# Patient Record
Sex: Female | Born: 1960 | Hispanic: No | Smoking: Former smoker
Health system: Southern US, Community
[De-identification: ages and names within clinical notes are randomized; demographics above are authoritative.]

## PROBLEM LIST (undated history)

## (undated) DIAGNOSIS — K219 Gastro-esophageal reflux disease without esophagitis: Secondary | ICD-10-CM

## (undated) DIAGNOSIS — T7840XA Allergy, unspecified, initial encounter: Secondary | ICD-10-CM

## (undated) DIAGNOSIS — D649 Anemia, unspecified: Secondary | ICD-10-CM

## (undated) DIAGNOSIS — A159 Respiratory tuberculosis unspecified: Secondary | ICD-10-CM

## (undated) HISTORY — PX: APPENDECTOMY: SHX54

## (undated) HISTORY — DX: Respiratory tuberculosis unspecified: A15.9

## (undated) HISTORY — PX: LIPOMA EXCISION: SHX5283

## (undated) HISTORY — DX: Allergy, unspecified, initial encounter: T78.40XA

## (undated) HISTORY — PX: URETHRAL SLING: SHX2621

## (undated) HISTORY — DX: Anemia, unspecified: D64.9

## (undated) HISTORY — DX: Gastro-esophageal reflux disease without esophagitis: K21.9

## (undated) HISTORY — PX: EXPLORATORY LAPAROTOMY: SUR591

## (undated) HISTORY — PX: ABDOMINAL HYSTERECTOMY: SHX81

## (undated) HISTORY — PX: TONSILLECTOMY: SUR1361

## (undated) HISTORY — PX: UPPER GASTROINTESTINAL ENDOSCOPY: SHX188

---

## 1998-03-05 ENCOUNTER — Other Ambulatory Visit: Admission: RE | Admit: 1998-03-05 | Discharge: 1998-03-05 | Payer: Self-pay | Admitting: Obstetrics and Gynecology

## 1999-05-04 ENCOUNTER — Other Ambulatory Visit: Admission: RE | Admit: 1999-05-04 | Discharge: 1999-05-04 | Payer: Self-pay | Admitting: Obstetrics and Gynecology

## 2009-03-05 IMAGING — US MAMMO-LUNI-US
1 series · 8 of 8 positions shown · non-contrast
Comparison: NONE

CLINICAL DATA: Bolahthuraa Nshrm RT(R)(M)   Diagnostic Mammogram. 

LEFT BREAST MAMMOGRAM ADDITIONAL VIEW AND LEFT BREAST ULTRASOUND

[Series 1: us breast · 0.07mm/px · 8 of 8 slices shown]
[im 1/8]
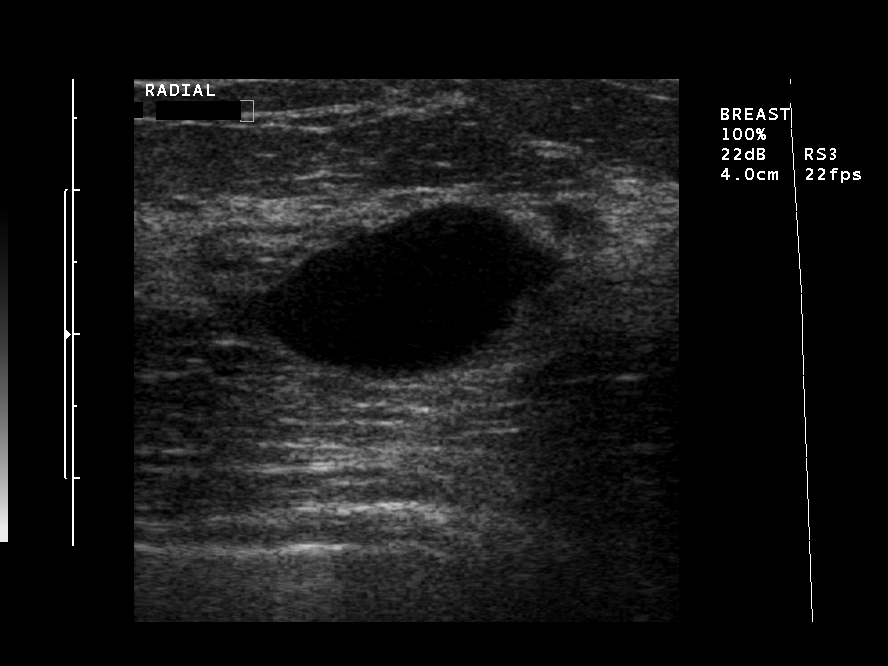
[im 2/8]
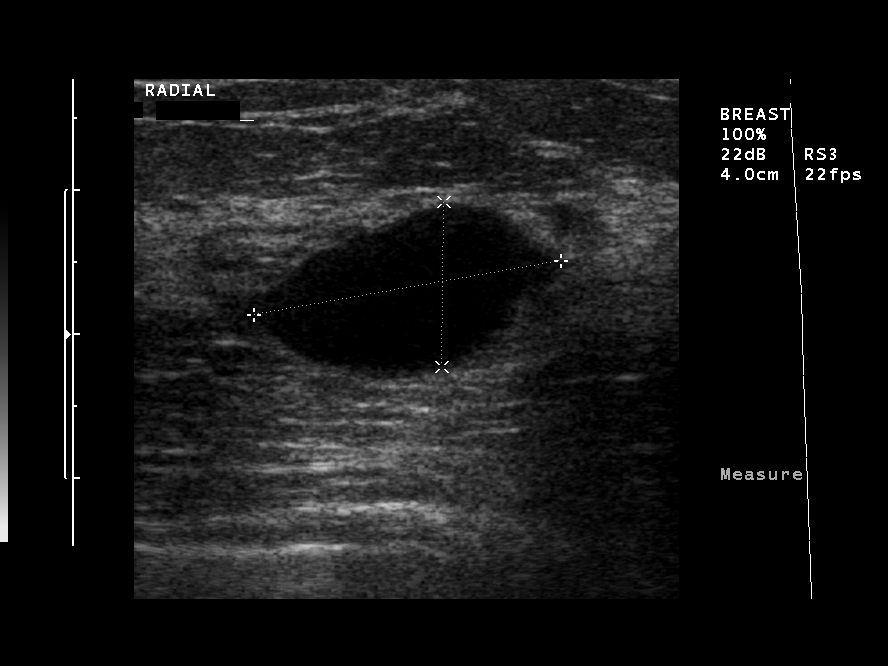
[im 3/8]
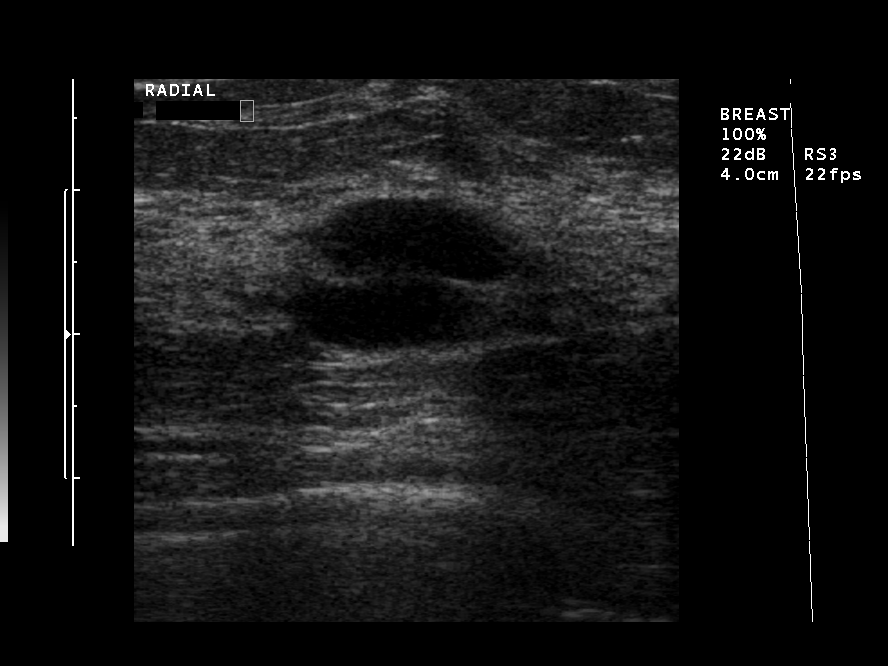
[im 4/8]
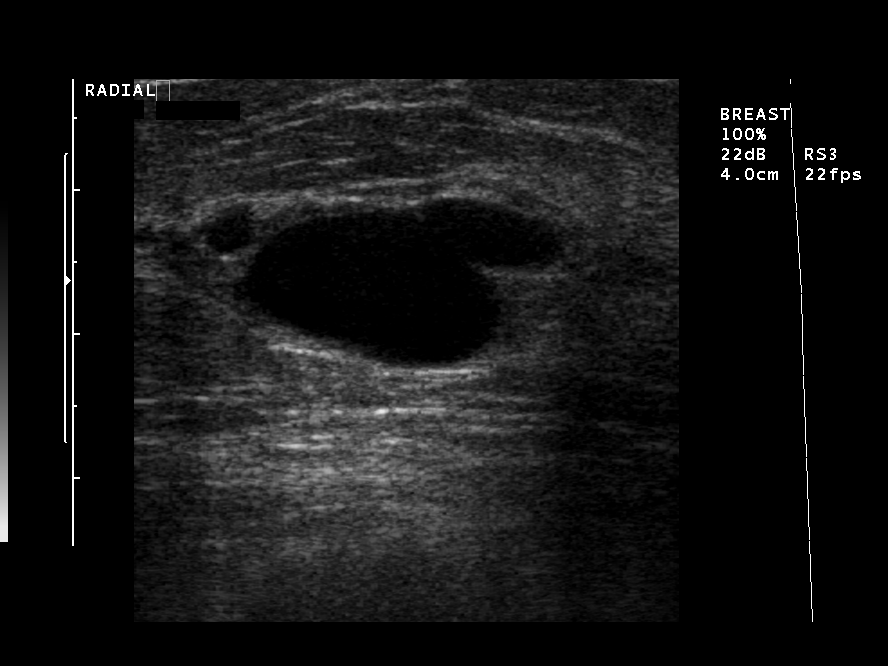
[im 5/8]
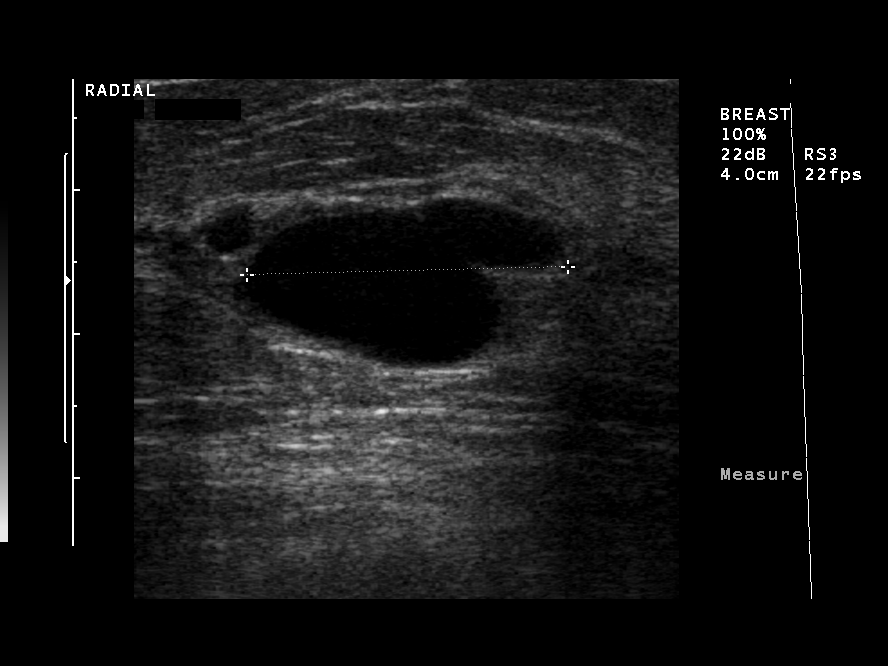
[im 6/8]
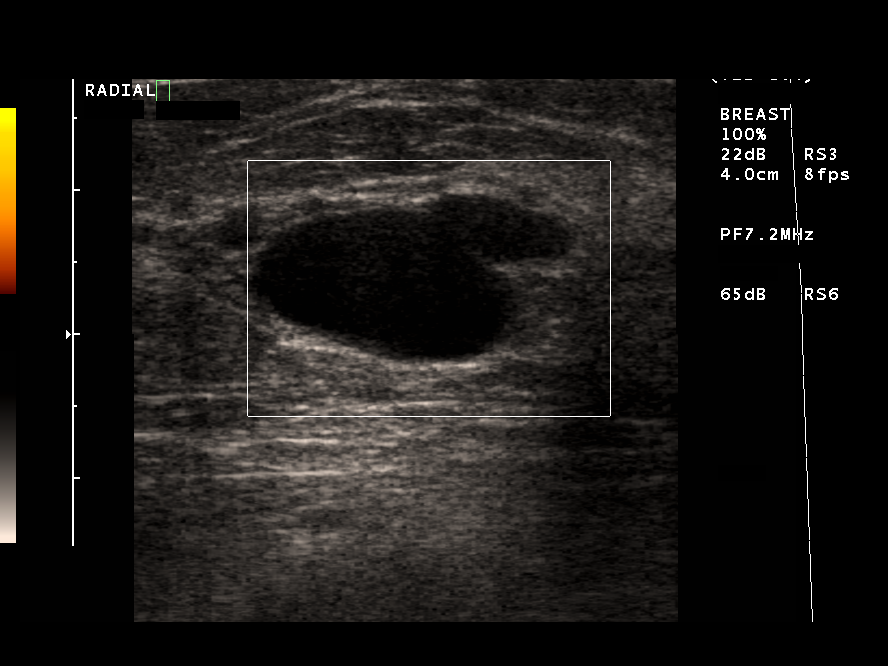
[im 7/8]
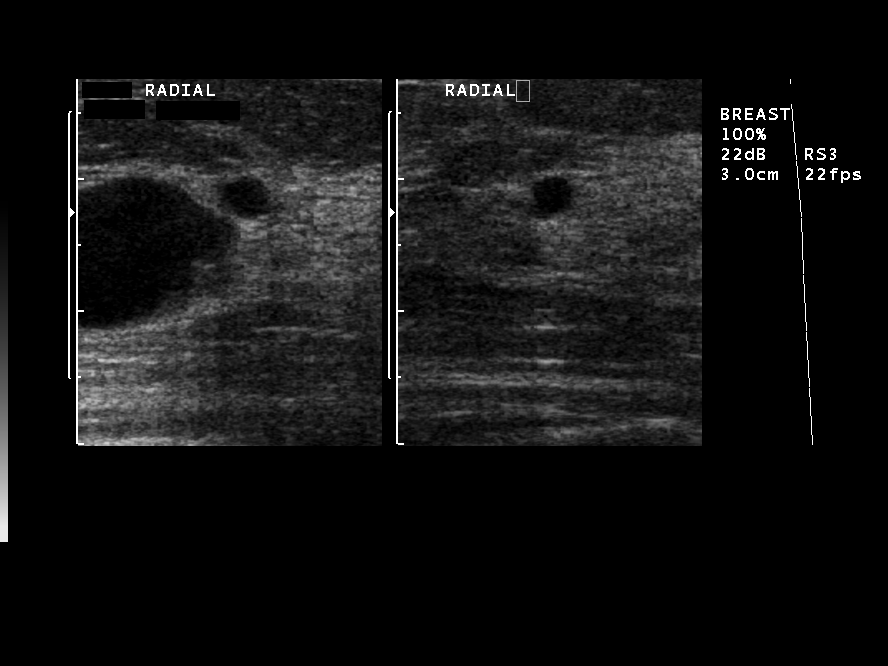
[im 8/8]
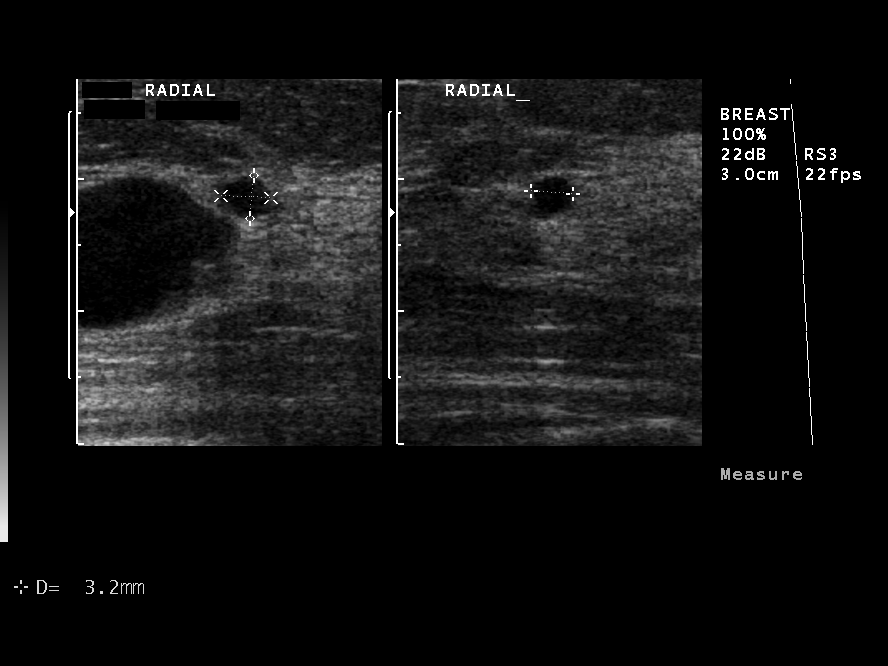

[8 of 8 positions shown; findings below may reference images not displayed]

FINDINGS: Increasing density in the left breast in the 12 
o???clock position corresponds to a cyst measuring 22.3 x 21.6 x 
11.5 mm.  This likely represents a cluster of cysts.  There is an 
adjacent tiny cyst measuring 3.2 x 3.8 x 3.3 mm.
IMPRESSION: Increasing density in the left breast 
mammographically is consistent with a cluster of benign cysts with 
a large dominant cyst, with dimensions given above.  Bilateral 
mammogram in one year is recommended. The patient was informed at 
the time of the examination of the findings and recommendations by 
verbal and written lay report. Computer assisted (Second Look) 
technology was used as an aid in interpretation of this study. 
BI-RADS 2- Benign Guarneri, Reynaert electronically reviewed 
on 11/21/2006 Dict Date: 11/20/2006  Tran Date:  11/21/2006 GIJSJE  
SHAMYRAT

## 2009-05-06 ENCOUNTER — Other Ambulatory Visit: Admission: RE | Admit: 2009-05-06 | Discharge: 2009-05-06 | Payer: Self-pay | Admitting: Radiology

## 2012-12-27 ENCOUNTER — Emergency Department (HOSPITAL_BASED_OUTPATIENT_CLINIC_OR_DEPARTMENT_OTHER)
Admission: EM | Admit: 2012-12-27 | Discharge: 2012-12-27 | Disposition: A | Discharge status: Home / Self Care | Payer: BC Managed Care – PPO | Attending: Emergency Medicine | Admitting: Emergency Medicine

## 2012-12-27 ENCOUNTER — Encounter (HOSPITAL_BASED_OUTPATIENT_CLINIC_OR_DEPARTMENT_OTHER): Payer: Self-pay | Admitting: Emergency Medicine

## 2012-12-27 ENCOUNTER — Emergency Department (HOSPITAL_BASED_OUTPATIENT_CLINIC_OR_DEPARTMENT_OTHER): Payer: BC Managed Care – PPO

## 2012-12-27 DIAGNOSIS — Z90710 Acquired absence of both cervix and uterus: Secondary | ICD-10-CM | POA: Insufficient documentation

## 2012-12-27 DIAGNOSIS — L500 Allergic urticaria: Secondary | ICD-10-CM | POA: Insufficient documentation

## 2012-12-27 DIAGNOSIS — Z91040 Latex allergy status: Secondary | ICD-10-CM | POA: Insufficient documentation

## 2012-12-27 DIAGNOSIS — T360X5A Adverse effect of penicillins, initial encounter: Secondary | ICD-10-CM | POA: Insufficient documentation

## 2012-12-27 DIAGNOSIS — T7840XA Allergy, unspecified, initial encounter: Secondary | ICD-10-CM

## 2012-12-27 DIAGNOSIS — L509 Urticaria, unspecified: Secondary | ICD-10-CM

## 2012-12-27 DIAGNOSIS — Z79899 Other long term (current) drug therapy: Secondary | ICD-10-CM | POA: Insufficient documentation

## 2012-12-27 DIAGNOSIS — F411 Generalized anxiety disorder: Secondary | ICD-10-CM | POA: Insufficient documentation

## 2012-12-27 MED ORDER — DIPHENHYDRAMINE HCL 50 MG/ML IJ SOLN
25.0000 mg | Freq: Once | INTRAMUSCULAR | Status: AC
  Administered 2012-12-27: 25 mg via INTRAVENOUS

## 2012-12-27 MED ORDER — LORAZEPAM 1 MG PO TABS
0.5000 mg | ORAL_TABLET | Freq: Once | ORAL | Status: AC
  Administered 2012-12-27: 0.5 mg via ORAL
  Filled 2012-12-27: qty 1

## 2012-12-27 MED ORDER — SODIUM CHLORIDE 0.9 % IV BOLUS (SEPSIS)
1000.0000 mL | Freq: Once | INTRAVENOUS | Status: AC
  Administered 2012-12-27: 1000 mL via INTRAVENOUS

## 2012-12-27 MED ORDER — DIPHENHYDRAMINE HCL 25 MG PO CAPS
50.0000 mg | ORAL_CAPSULE | Freq: Once | ORAL | Status: AC
  Administered 2012-12-27: 50 mg via ORAL
  Filled 2012-12-27: qty 2

## 2012-12-27 MED ORDER — DIPHENHYDRAMINE HCL 50 MG/ML IJ SOLN
INTRAMUSCULAR | Status: AC
  Administered 2012-12-27: 25 mg via INTRAVENOUS
  Filled 2012-12-27: qty 1

## 2012-12-27 MED ORDER — EPINEPHRINE 0.3 MG/0.3ML IJ SOAJ: 0.3000 mg | Freq: Once | INTRAMUSCULAR | Status: AC

## 2012-12-27 MED ORDER — PREDNISONE 20 MG PO TABS: ORAL_TABLET | ORAL | Status: DC

## 2012-12-27 NOTE — ED Notes (Addendum)
Pt c/o rash to augmentin and went to Urgent care 1 hr ago and was given depo-medrol injection and pt sts rash is worse since. Pt has diffuse urticaria and redness to hands and feet. No airway or oral swelling at present. Pt speaking in complete sentences.

## 2012-12-27 NOTE — ED Notes (Signed)
Pt states that she feels her rash is worse, states "I just feel bad..." this rn observes that rash is subsiding. Pt states "I don't think it's getting better at all..."

## 2012-12-27 NOTE — ED Notes (Signed)
No drooling noted when looking at the pt.   Pt. Able to swallow her pill.

## 2012-12-27 NOTE — ED Notes (Signed)
Pt requests to speak with md, dr. Jodi Mourning alerted.

## 2012-12-27 NOTE — ED Notes (Signed)
MD at bedside. 

## 2012-12-27 NOTE — ED Notes (Signed)
Pt. Up and walking steady gait to restroom.  NO distress noted.

## 2012-12-27 NOTE — ED Notes (Signed)
Pt states "I refuse to leave. I feel like there is something going on with my throat, and I am scared. I am not leaving."

## 2012-12-27 NOTE — ED Provider Notes (Addendum)
CSN: [id number]     Arrival date & time 12/27/12  4098 History   First MD Initiated Contact with Patient 12/27/12 0932     Chief Complaint  Patient presents with  . 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  Patient had hysterectomy/ sling procedure one week ago, developed sinus congestion and placed on augmentin two days prior, after three doses had mild rash and swelling extremities, worsened today after fourth dose.  In urgent care worsened further after solumedrol.  No hx of significant allergic rx except with erythromycin.  No other new exposures.  Pelvic incisions healing well.  No fevers or vomiting.  No abdo pain.  No tongue swelling.    Patient is a 52 y.o. female presenting with allergic reaction. The history is provided by the patient.  Allergic Reaction Presenting symptoms: rash     History reviewed. No pertinent past medical history. Past Surgical History  Procedure Laterality Date  . Abdominal hysterectomy    . Urethral sling     No family history on file. History  Substance Use Topics  . Smoking status: Never Smoker   . Smokeless tobacco: Not on file  . Alcohol Use: Yes   OB History   Grav Para Term Preterm Abortions TAB SAB Ect Mult Living                 Review of Systems  Constitutional: Negative for fever and chills.  HENT: Negative for congestion.   Eyes: Negative for visual disturbance.  Respiratory: Negative for shortness of breath and stridor.   Gastrointestinal: Negative for vomiting and abdominal pain.  Genitourinary: Negative for dysuria and flank pain.  Musculoskeletal: Negative for neck pain and neck stiffness.  Skin: Positive for rash.  Neurological: Negative for light-headedness and headaches.  Psychiatric/Behavioral: The patient is nervous/anxious.      Allergies  Augmentin; Erythromycin; and Latex  Home Medications   Current Outpatient Rx  Name  Route  Sig  Dispense  Refill  . diphenhydrAMINE (BENADRYL) 25 MG tablet   Oral   Take 25 mg by mouth every 6 (six) hours as needed.         . Ibuprofen (MOTRIN PO)   Oral   Take by mouth.         . magnesium 30 MG tablet   Oral   Take 30 mg by mouth 2 (two) times daily.         . ValACYclovir HCl (VALTREX PO)   Oral   Take by mouth.          There were no vitals taken for this visit. Physical Exam  Nursing note and vitals reviewed. Constitutional: She is oriented to person, place, and time. She appears well-developed and well-nourished.  HENT:  Head: Normocephalic and atraumatic.  No trismus, uvular deviation, unilateral posterior pharyngeal edema or submandibular swelling.   Eyes: Conjunctivae are normal. Right eye exhibits no discharge. Left eye exhibits no discharge.  Neck: Normal range of motion. Neck supple. No tracheal deviation present.  Cardiovascular: Normal rate and regular rhythm.   Pulmonary/Chest: Effort normal and breath sounds normal.  Abdominal: Soft.  Neurological: She is alert and oriented to person, place, and time.  Skin: Skin is warm. Rash noted.  Patchy and local hives, some elevated on LE/ feet bilateral, no petechia or purpura No angioedema  Psychiatric: She has a normal mood and affect.  ED Course  Procedures (including critical care time) Labs Review Labs Reviewed - No data to display Imaging Review Dg Neck Soft Tissue  12/27/2012   CLINICAL DATA:  Rash and swelling after taking Augmentin for several days. Solu-Medrol given at Urgent Care Clinic.  EXAM: NECK SOFT TISSUES - 1+ VIEW  COMPARISON:  None.  FINDINGS: Epiglottis and aryepiglottic folds appear within normal limits. Prevertebral soft tissues are normal. Moderate lower cervical spondylosis is present with anterior spurring. Slight reversal of the normal cervical lordosis  which is probably positional.  IMPRESSION: Normal soft tissue neck.  Cervical spondylosis.   Electronically Signed   By: Andreas Newport M.D.   On: 12/27/2012 14:25    EKG Interpretation   None       MDM   1. Hives   2. Allergic reaction, initial encounter    Allergic reaction.  No angioedema. Observed in ED 3 hrs.  Plan for benadryl/ fluids and observation for 2 hrs. Recheck. Pt improved on recheck mild hives. No angioedema, reasons to return discussed.   Results and differential diagnosis were discussed with the patient. Close follow up outpatient was discussed, patient comfortable with the plan.         Enid Skeens, MD 12/27/12 1223  Pt felt uncomfortable going home, felt scratching in her throat, pt anxious in the room. Discussed lateral xray to reassure, recheck exam lungs clear, no stridor, no angioedema. Reassurred pt.  Benadryl and fluids given. Pt well appearing, mild hives.  Sending home with epi pen in case needed, return instructions given.  Discharged  Enid Skeens, MD 12/27/12 [phone]

## 2014-03-04 ENCOUNTER — Encounter: Payer: Self-pay | Admitting: Internal Medicine

## 2014-04-30 ENCOUNTER — Encounter: Payer: Self-pay | Admitting: Internal Medicine

## 2014-05-05 ENCOUNTER — Encounter: Payer: Self-pay | Admitting: Internal Medicine

## 2014-05-14 ENCOUNTER — Encounter: Payer: Self-pay | Admitting: Internal Medicine

## 2014-06-04 ENCOUNTER — Encounter: Payer: Self-pay | Admitting: Internal Medicine

## 2014-07-09 ENCOUNTER — Encounter: Payer: Self-pay | Admitting: Internal Medicine

## 2014-08-11 ENCOUNTER — Encounter: Payer: Self-pay | Admitting: Gastroenterology

## 2014-09-02 ENCOUNTER — Encounter: Payer: Self-pay | Admitting: Internal Medicine

## 2014-10-16 ENCOUNTER — Ambulatory Visit (AMBULATORY_SURGERY_CENTER): Payer: Self-pay

## 2014-10-16 VITALS — Ht 62.0 in | Wt 123.8 lb

## 2014-10-16 DIAGNOSIS — Z1211 Encounter for screening for malignant neoplasm of colon: Secondary | ICD-10-CM

## 2014-10-16 MED ORDER — SUPREP BOWEL PREP KIT 17.5-3.13-1.6 GM/177ML PO SOLN: 1.0000 | Freq: Once | ORAL | Status: DC

## 2014-10-16 NOTE — Progress Notes (Signed)
No allergies to eggs or soy No diet/weight loss meds No home oxygen No past problems with anesthesia  Refused emmi 

## 2014-10-29 ENCOUNTER — Telehealth: Payer: Self-pay | Admitting: Gastroenterology

## 2014-10-29 NOTE — Telephone Encounter (Signed)
Patient is having a scar revision today.  2 inch scar on her umbilicus.  She is scheduled for a colonoscopy for 11/13/14.  Is this ok ?  Does she need to move the colonoscopy further out?  Please advise

## 2014-10-29 NOTE — Telephone Encounter (Signed)
Patient notified She would like to keep the procedure as scheduled

## 2014-10-29 NOTE — Telephone Encounter (Signed)
2 Quade will be fine, but given its elective colonoscopy if she prefers to wait few more Hata prior to scheduling colonoscopy is alright

## 2014-11-06 ENCOUNTER — Encounter: Payer: Self-pay | Admitting: Gastroenterology

## 2014-11-13 ENCOUNTER — Ambulatory Visit (AMBULATORY_SURGERY_CENTER): Payer: BC Managed Care – PPO | Admitting: Gastroenterology

## 2014-11-13 ENCOUNTER — Encounter: Payer: Self-pay | Admitting: Gastroenterology

## 2014-11-13 VITALS — BP 128/83 | HR 57 | Temp 97.7°F | Resp 16 | Ht 62.0 in | Wt 123.0 lb

## 2014-11-13 DIAGNOSIS — Z1211 Encounter for screening for malignant neoplasm of colon: Secondary | ICD-10-CM

## 2014-11-13 MED ORDER — SODIUM CHLORIDE 0.9 % IV SOLN: 500.0000 mL | INTRAVENOUS | Status: DC

## 2014-11-13 NOTE — Progress Notes (Signed)
Pt pain has improved Ready for d/c, instructed to call Dr. On call if sx's worsen Orvan SeenJill Smith,RN

## 2014-11-13 NOTE — Progress Notes (Addendum)
Pain has improved 4/10, repositioned on fours with rocking technique Will continue to monitor

## 2014-11-13 NOTE — Progress Notes (Addendum)
Pt c/o upper severe sharp, epigastric pain,10/10 attempted several repositioning but no relief,  States " its not gas pain; its feels like labor" Denies nausea or vomiting  Levsin 0.125 mg given, no relief Dr. Lavon PaganiniNandigam notified  for further evaluation

## 2014-11-13 NOTE — Patient Instructions (Signed)
YOU HAD AN ENDOSCOPIC PROCEDURE TODAY AT THE [facility]:   Refer to the procedure report that was given to you for any specific questions about what was found during the examination.  If the procedure report does not answer your questions, please call your gastroenterologist to clarify.  If you requested that your care partner not be given the details of your procedure findings, then the procedure report has been included in a sealed envelope for you to review at your convenience later.  YOU SHOULD EXPECT: Some feelings of bloating in the abdomen. Passage of more gas than usual.  Walking can help get rid of the air that was put into your GI tract during the procedure and reduce the bloating. If you had a lower endoscopy (such as a colonoscopy or flexible sigmoidoscopy) you may notice spotting of blood in your stool or on the toilet paper. If you underwent a bowel prep for your procedure, you may not have a normal bowel movement for a few days.  Please Note:  You might notice some irritation and congestion in your nose or some drainage.  This is from the oxygen used during your procedure.  There is no need for concern and it should clear up in a day or so.  SYMPTOMS TO REPORT IMMEDIATELY:   Following lower endoscopy (colonoscopy or flexible sigmoidoscopy):  Excessive amounts of blood in the stool  Significant tenderness or worsening of abdominal pains  Swelling of the abdomen that is new, acute  Fever of 100F or higher  For urgent or emergent issues, a gastroenterologist can be reached at any hour by calling [phone].   DIET: Your first meal following the procedure should be a small meal and then it is ok to progress to your normal diet. Heavy or fried foods are harder to digest and may make you feel nauseous or bloated.  Likewise, meals heavy in dairy and vegetables can increase bloating.  Drink plenty of fluids but you should avoid alcoholic beverages for 24  hours.  ACTIVITY:  You should plan to take it easy for the rest of today and you should NOT DRIVE or use heavy machinery until tomorrow (because of the sedation medicines used during the test).    FOLLOW UP: Our staff will call the number listed on your records the next business day following your procedure to check on you and address any questions or concerns that you may have regarding the information given to you following your procedure. If we do not reach you, we will leave a message.  However, if you are feeling well and you are not experiencing any problems, there is no need to return our call.  We will assume that you have returned to your regular daily activities without incident.  If any biopsies were taken you will be contacted by phone or by letter within the next 1-3 Gagliardo.  Please call us at [phone] if you have not heard about the biopsies in 3 Gayle.    SIGNATURES/CONFIDENTIALITY: You and/or your care partner have signed paperwork which will be entered into your electronic medical record.  These signatures attest to the fact that that the information above on your After Visit Summary has been reviewed and is understood.  Full responsibility of the confidentiality of this discharge information lies with you and/or your care-partner.  Repeat Colonoscopy in 10 years 

## 2014-11-13 NOTE — Progress Notes (Signed)
Report to PACU, RN, vss, BBS= Clear.  

## 2014-11-13 NOTE — Progress Notes (Addendum)
Dr. Lavon PaganiniNandigam with pt. Pt states she is feeling much better, pain is improved 7/10 with ambulation and warm compress Abdomen soft, non distended  Will continue to monitor

## 2014-11-13 NOTE — Op Note (Signed)
[facility] [address] [place], [id number]   COLONOSCOPY PROCEDURE REPORT  PATIENT: Barbara Armstrong, [name]  MR#: [id number] BIRTHDATE: 1961-01-10 , 54  yrs. old GENDER: female ENDOSCOPIST: Marsa ArisKavitha Jahmal Dunavant, MD REFERRED XB:JYNWBY:Jill Loreta AveWagner MD PROCEDURE DATE:  11/13/2014 PROCEDURE:   Colonoscopy, screening First Screening Colonoscopy - Avg.  risk and is 50 yrs.  old or older Yes.  Prior Negative Screening - Now for repeat screening. N/A  History of Adenoma - Now for follow-up colonoscopy & has been > or = to 3 yrs.  N/A  Polyps removed today? No Recommend repeat exam, <10 yrs? No ASA CLASS:   Class I INDICATIONS:Screening for colonic neoplasia and Colorectal Neoplasm Risk Assessment for this procedure is average risk. MEDICATIONS: Propofol 250 mg IV  DESCRIPTION OF PROCEDURE:   After the risks benefits and alternatives of the procedure were thoroughly explained, informed consent was obtained.  The digital rectal exam revealed no abnormalities of the rectum.   The LB [id number]-[id number]  endoscope was introduced through the anus and advanced to the cecum, which was identified by both the appendix and ileocecal valve. No adverse events experienced.   The quality of the prep was fair.  adequate visualization of mucosa after suction and washing.  The instrument was then slowly withdrawn as the colon was fully examined. Estimated blood loss is zero unless otherwise noted in this procedure report.   COLON FINDINGS: The colonic mucosa appeared normal in the sigmoid colon, rectum, descending colon, ascending colon, and transverse colon.  Retroflexed views revealed internal hemorrhoids. The time to cecum = 6.0 Withdrawal time = 12.0   The scope was withdrawn and the procedure completed. COMPLICATIONS: There were no immediate complications.  ENDOSCOPIC IMPRESSION: The colonic mucosa appeared normal in the sigmoid colon, rectum, descending colon, ascending colon, and transverse  colon  RECOMMENDATIONS: You should continue to follow colorectal cancer screening guidelines for "routine risk" patients with a repeat colonoscopy in 10 years.  Extended bowel prep  for recall colonoscopy  eSigned:  Marsa ArisKavitha Lior Cartelli, MD 11/13/2014 2:11 PM

## 2014-11-13 NOTE — Progress Notes (Addendum)
Pt pain is improving, passing liquid/gas Pain level decreased 4/10 Pt in restroom Will continue to monitor

## 2014-11-14 ENCOUNTER — Telehealth: Payer: Self-pay | Admitting: *Deleted

## 2014-11-14 NOTE — Telephone Encounter (Signed)
  Follow up Call-  Call back number 11/13/2014  Post procedure Call Back phone  # [phone] cell  Permission to leave phone message Yes     Patient questions:  Do you have a fever, pain , or abdominal swelling? No. Pain Score  0 *  Have you tolerated food without any problems? Yes.    Have you been able to return to your normal activities? Yes.    Do you have any questions about your discharge instructions: Diet   No. Medications  No. Follow up visit  No.  Do you have questions or concerns about your Care? No.  Actions: * If pain score is 4 or above: No action needed, pain <4.

## 2014-12-12 ENCOUNTER — Ambulatory Visit: Payer: BC Managed Care – PPO | Admitting: Physician Assistant

## 2015-04-12 IMAGING — CR DG NECK SOFT TISSUE
1 series · 1 of 1 positions shown · non-contrast
Comparison: None.

CLINICAL DATA: Rash and swelling after taking Augmentin for several
days. Solu-Medrol given at [REDACTED].

EXAM:
NECK SOFT TISSUES - 1+ VIEW

[w soft tissue neck]
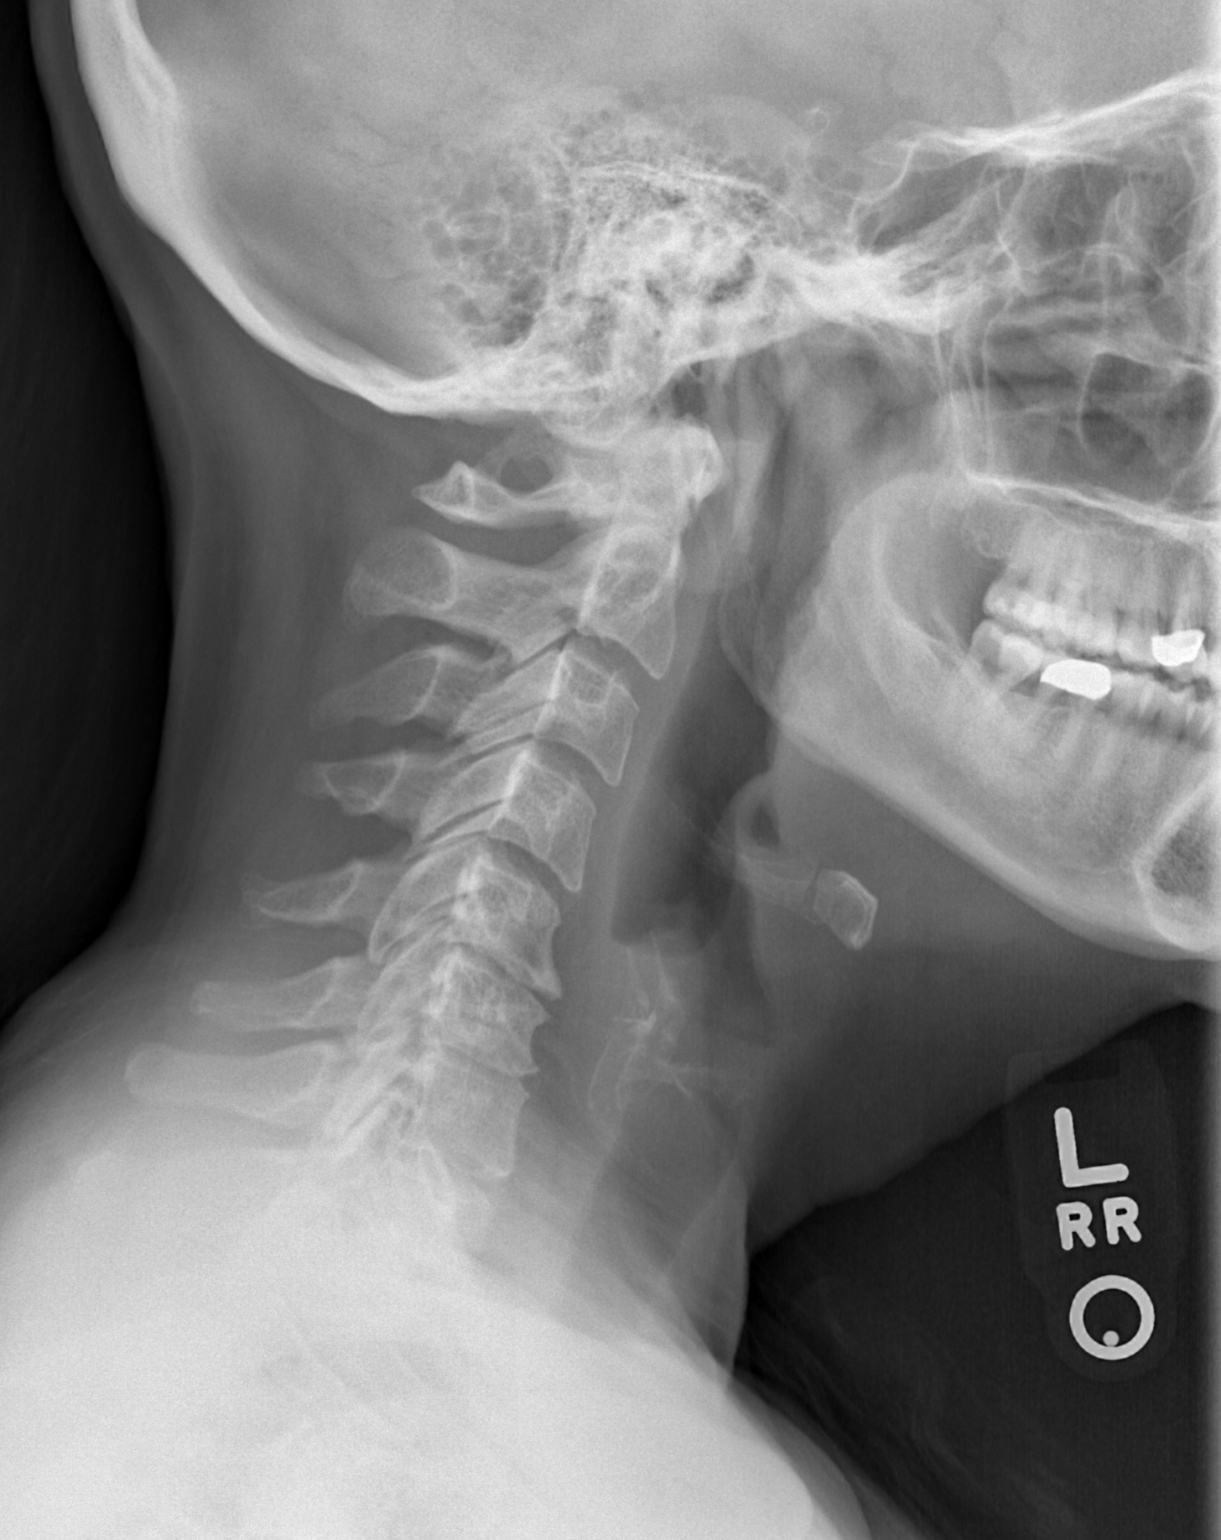

[1 of 1 positions shown; findings below may reference images not displayed]

FINDINGS: Epiglottis and aryepiglottic folds appear within normal limits.
Prevertebral soft tissues are normal. Moderate lower cervical
spondylosis is present with anterior spurring. Slight reversal of
the normal cervical lordosis which is probably positional.
IMPRESSION: Normal soft tissue neck.  Cervical spondylosis.
